# Patient Record
Sex: Male | Born: 1995 | Race: Black or African American | Hispanic: No | Marital: Single | Smoking: Never smoker
Health system: Southern US, Community
[De-identification: ages and names within clinical notes are randomized; demographics above are authoritative.]

---
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
## 2016-01-24 ENCOUNTER — Emergency Department (HOSPITAL_COMMUNITY)
Admission: EM | Admit: 2016-01-24 | Discharge: 2016-01-24 | Disposition: A | Discharge status: Home / Self Care | Payer: Self-pay | Attending: Emergency Medicine | Admitting: Emergency Medicine

## 2016-01-24 ENCOUNTER — Encounter (HOSPITAL_COMMUNITY): Payer: Self-pay | Admitting: *Deleted

## 2016-01-24 ENCOUNTER — Emergency Department (HOSPITAL_COMMUNITY): Payer: Self-pay

## 2016-01-24 DIAGNOSIS — F10120 Alcohol abuse with intoxication, uncomplicated: Secondary | ICD-10-CM | POA: Insufficient documentation

## 2016-01-24 DIAGNOSIS — F10920 Alcohol use, unspecified with intoxication, uncomplicated: Secondary | ICD-10-CM

## 2016-01-24 LAB — I-STAT TROPONIN, ED: Troponin i, poc: 0 ng/mL (ref 0.00–0.08)

## 2016-01-24 LAB — COMPREHENSIVE METABOLIC PANEL
ALT: 35 U/L (ref 17–63)
ANION GAP: 10 (ref 5–15)
AST: 29 U/L (ref 15–41)
Albumin: 4 g/dL (ref 3.5–5.0)
Alkaline Phosphatase: 52 U/L (ref 38–126)
BILIRUBIN TOTAL: 0.3 mg/dL (ref 0.3–1.2)
BUN: 10 mg/dL (ref 6–20)
CALCIUM: 8.2 mg/dL — AB (ref 8.9–10.3)
CO2: 21 mmol/L — ABNORMAL LOW (ref 22–32)
Chloride: 106 mmol/L (ref 101–111)
Creatinine, Ser: 1.08 mg/dL (ref 0.61–1.24)
Glucose, Bld: 105 mg/dL — ABNORMAL HIGH (ref 65–99)
POTASSIUM: 3.3 mmol/L — AB (ref 3.5–5.1)
Sodium: 137 mmol/L (ref 135–145)
TOTAL PROTEIN: 6.6 g/dL (ref 6.5–8.1)

## 2016-01-24 LAB — CBC
HEMATOCRIT: 40.5 % (ref 39.0–52.0)
HEMOGLOBIN: 13.6 g/dL (ref 13.0–17.0)
MCH: 27.9 pg (ref 26.0–34.0)
MCHC: 33.6 g/dL (ref 30.0–36.0)
MCV: 83.2 fL (ref 78.0–100.0)
Platelets: 242 10*3/uL (ref 150–400)
RBC: 4.87 MIL/uL (ref 4.22–5.81)
RDW: 13.3 % (ref 11.5–15.5)
WBC: 7.5 10*3/uL (ref 4.0–10.5)

## 2016-01-24 LAB — RAPID URINE DRUG SCREEN, HOSP PERFORMED
Amphetamines: NOT DETECTED
BARBITURATES: NOT DETECTED
BENZODIAZEPINES: NOT DETECTED
COCAINE: NOT DETECTED
Opiates: NOT DETECTED
TETRAHYDROCANNABINOL: NOT DETECTED

## 2016-01-24 LAB — ETHANOL: Alcohol, Ethyl (B): 248 mg/dL — ABNORMAL HIGH (ref <5)

## 2016-01-24 LAB — ACETAMINOPHEN LEVEL

## 2016-01-24 LAB — SALICYLATE LEVEL: Salicylate Lvl: <4 mg/dL (ref 2.8–30.0)

## 2016-01-24 LAB — CBG MONITORING, ED: GLUCOSE-CAPILLARY: 104 mg/dL — AB (ref 65–99)

## 2016-01-24 MED ORDER — SODIUM CHLORIDE 0.9 % IV BOLUS (SEPSIS)
1000.0000 mL | Freq: Once | INTRAVENOUS | Status: AC
  Administered 2016-01-24: 1000 mL via INTRAVENOUS

## 2016-01-24 MED ORDER — NALOXONE HCL 0.4 MG/ML IJ SOLN
0.4000 mg | Freq: Once | INTRAMUSCULAR | Status: AC
  Administered 2016-01-24: 0.4 mg via INTRAVENOUS

## 2016-01-24 MED ORDER — NALOXONE HCL 0.4 MG/ML IJ SOLN
INTRAMUSCULAR | Status: AC
  Filled 2016-01-24: qty 1

## 2016-01-24 NOTE — Discharge Instructions (Signed)
Avoid drinking too much alcohol.   Stay hydrated.   See your doctor.   Return to ER if you have vomiting, lethargy, passing out.

## 2016-01-24 NOTE — ED Notes (Addendum)
[facility] called to home for unresponsive 20 yo.  When EMS arrived pt was responsive to pain and was vomiting (pt held up in chair by fam).  CBG 148.  Initial O2 sats low 90's, given 500 ns en-route. ST elevation in V2 and V3. Dad states pt has been depressed for last few weeks and has been fighting with girlfriend.  Drank approx 1.5 pints vodka.  No drugs noted on scene.  LSN 1530.

## 2016-01-24 NOTE — ED Provider Notes (Signed)
CSN: [id number]     Arrival date & time 01/24/16  1757 History   First MD Initiated Contact with Patient 01/24/16 1800     Chief Complaint  Patient presents with  . Altered Mental Status   HPI  [facility] called to home for unresponsive 20 yo. When EMS arrived pt was responsive to pain and was vomiting (pt held up in chair by fam). CBG 148. Initial O2 sats low 90's, given 500 ns en-route. ST elevation in V2 and V3. Dad states pt has been depressed for last few weeks and has been fighting with girlfriend. Drank approx 1.5 pints vodka. No drugs noted on scene. LSN 1530. Per family, no known substance abuse issues. Not known to be suicidal. History limited due to patient's mental condition.  History reviewed. No pertinent past medical history. History reviewed. No pertinent past surgical history. No family history on file. Social History  Substance Use Topics  . Smoking status: Never Smoker   . Smokeless tobacco: None  . Alcohol Use: Yes     Comment: occ    Review of Systems  Unable to perform ROS: Mental status change   Allergies  Review of patient's allergies indicates no known allergies.  Home Medications   Prior to Admission medications   Not on File   BP 112/75 mmHg  Pulse 82  Resp 26  SpO2 100% Physical Exam General: well nourished, well hydrated, no acute distress Eyes: conjunctivae and lids normal; PERRLA 3mm bilateral, atraumatic, pt actively closing eyes Neck: no masses/bruising, trachea midline Nose: no nasal septal hematoma Ears: atraumatic,  Respiratory: no intercostal retractions or use of accessory muscles, decreased breath sounds bilaterally Cardiovascular: Nml S1, S2; intact/symmetric distal pulses Gastrointestinal: Abdomen soft, non-distended, no masses, bowel sounds normal  Extremities: MAE, FROM, no cyanosis or edema MSK: no crepitus along face, scalp, clavicles, chest anteriorly or posteriorly, pelvis, right upper extremity, left upper extremity, right  lower extremity, left lower extremity.  Neuro: Limited secondary to lethargy. Patient minimally responsive to pain  ED Course  Procedures (including critical care time) Labs Review Labs Reviewed  COMPREHENSIVE METABOLIC PANEL - Abnormal; Notable for the following:    Potassium 3.3 (*)    CO2 21 (*)    Glucose, Bld 105 (*)    Calcium 8.2 (*)    All other components within normal limits  ETHANOL - Abnormal; Notable for the following:    Alcohol, Ethyl (B) 248 (*)    All other components within normal limits  ACETAMINOPHEN LEVEL - Abnormal; Notable for the following:    Acetaminophen (Tylenol), Serum <10 (*)    All other components within normal limits  CBG MONITORING, ED - Abnormal; Notable for the following:    Glucose-Capillary 104 (*)    All other components within normal limits  SALICYLATE LEVEL  CBC  URINE RAPID DRUG SCREEN, HOSP PERFORMED  I-STAT TROPOININ, ED    Imaging Review Dg Chest Portable 1 View  01/24/2016  CLINICAL DATA:  Altered mental status.  Aspiration. EXAM: PORTABLE CHEST 1 VIEW COMPARISON:  None FINDINGS: Portable supine views of the chest were obtained. Lungs are clear without airspace disease or pulmonary edema. Patient is mildly rotated towards the left. Heart size is within normal limits. Negative for a pneumothorax. No acute bone abnormality. IMPRESSION: No acute findings. Electronically Signed   By: Richarda Overlie M.D.   On: 01/24/2016 18:27   I have personally reviewed and evaluated these images and lab results as part of my medical decision-making.  EKG Interpretation   Date/Time:  Friday January 24 2016 18:02:39 EDT Ventricular Rate:  76 PR Interval:  144 QRS Duration: 104 QT Interval:  391 QTC Calculation: 440 R Axis:   85 Text Interpretation:  Sinus rhythm Consider right atrial enlargement  Probable left ventricular hypertrophy Probable inferior infarct, old  Lateral infarct, recent No significant change since last tracing earlier  in the day   Confirmed by YAO  MD, DAVID ([id number]) on 01/24/2016 6:11:41 PM      MDM   Final diagnoses:  Alcohol intoxication, uncomplicated (HCC)   Patient presents after drinking heavy alcohol, positive EtOH here. Initially very obtunded but responding to pain. Given fluids and monitored. Oxygen requirement here up did have decrease breath sounds bilaterally initially. Chest x-ray unremarkable 7:20p - pt reassessed, continues minimally responsive. Will ctm. Gf updated.  7:30p - she has waking up. Hugging everyone. 8pm - patient is conversive, no SI, labs are reassuring. Has family to accompany him. Patient will be discharged to continue to metabolize at home with strict return precautions.    Sidney AceAlison Charruf Melayah Skorupski, MD 01/24/16 2059  Richardean Canalavid H Yao, MD 01/25/16 [phone]

## 2018-01-27 IMAGING — CR DG CHEST 1V PORT
2 series · 2 of 2 positions shown · non-contrast
Comparison: None

CLINICAL DATA: Altered mental status.  Aspiration.

EXAM:
PORTABLE CHEST 1 VIEW

[AP (1 of 2)]
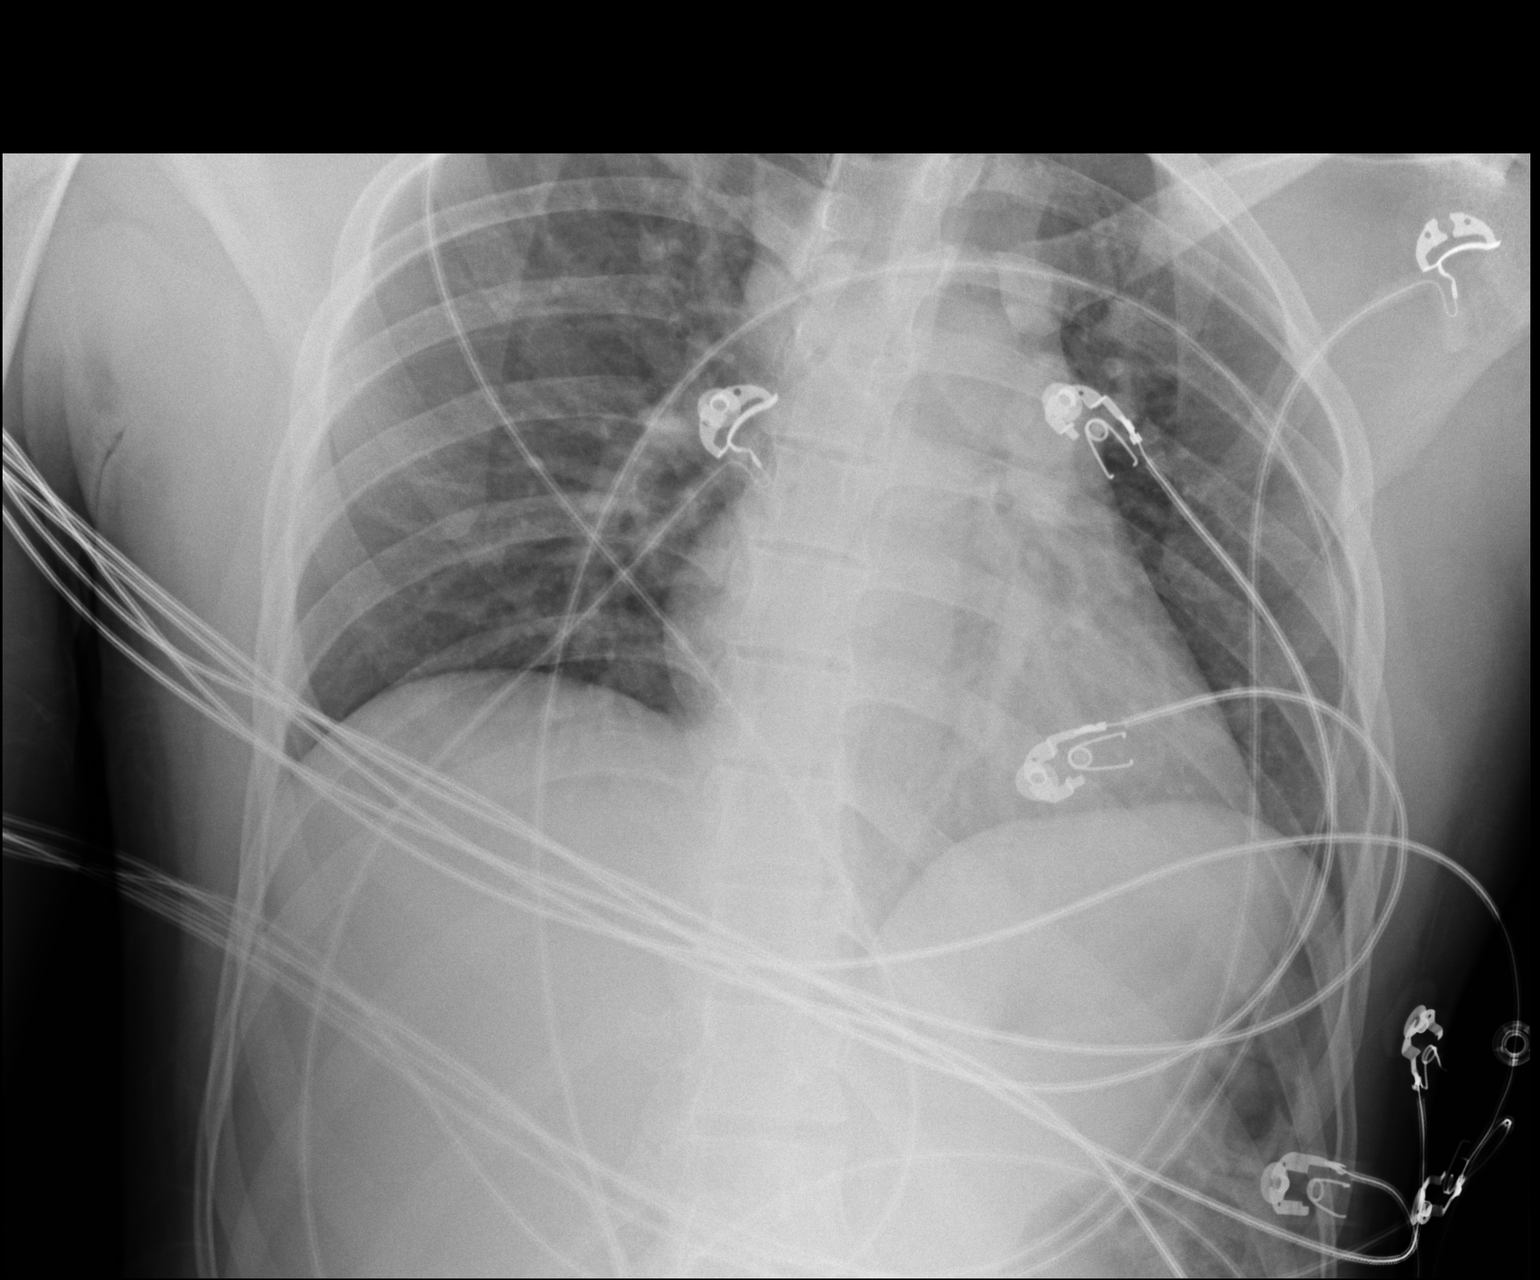

[AP (2 of 2)]
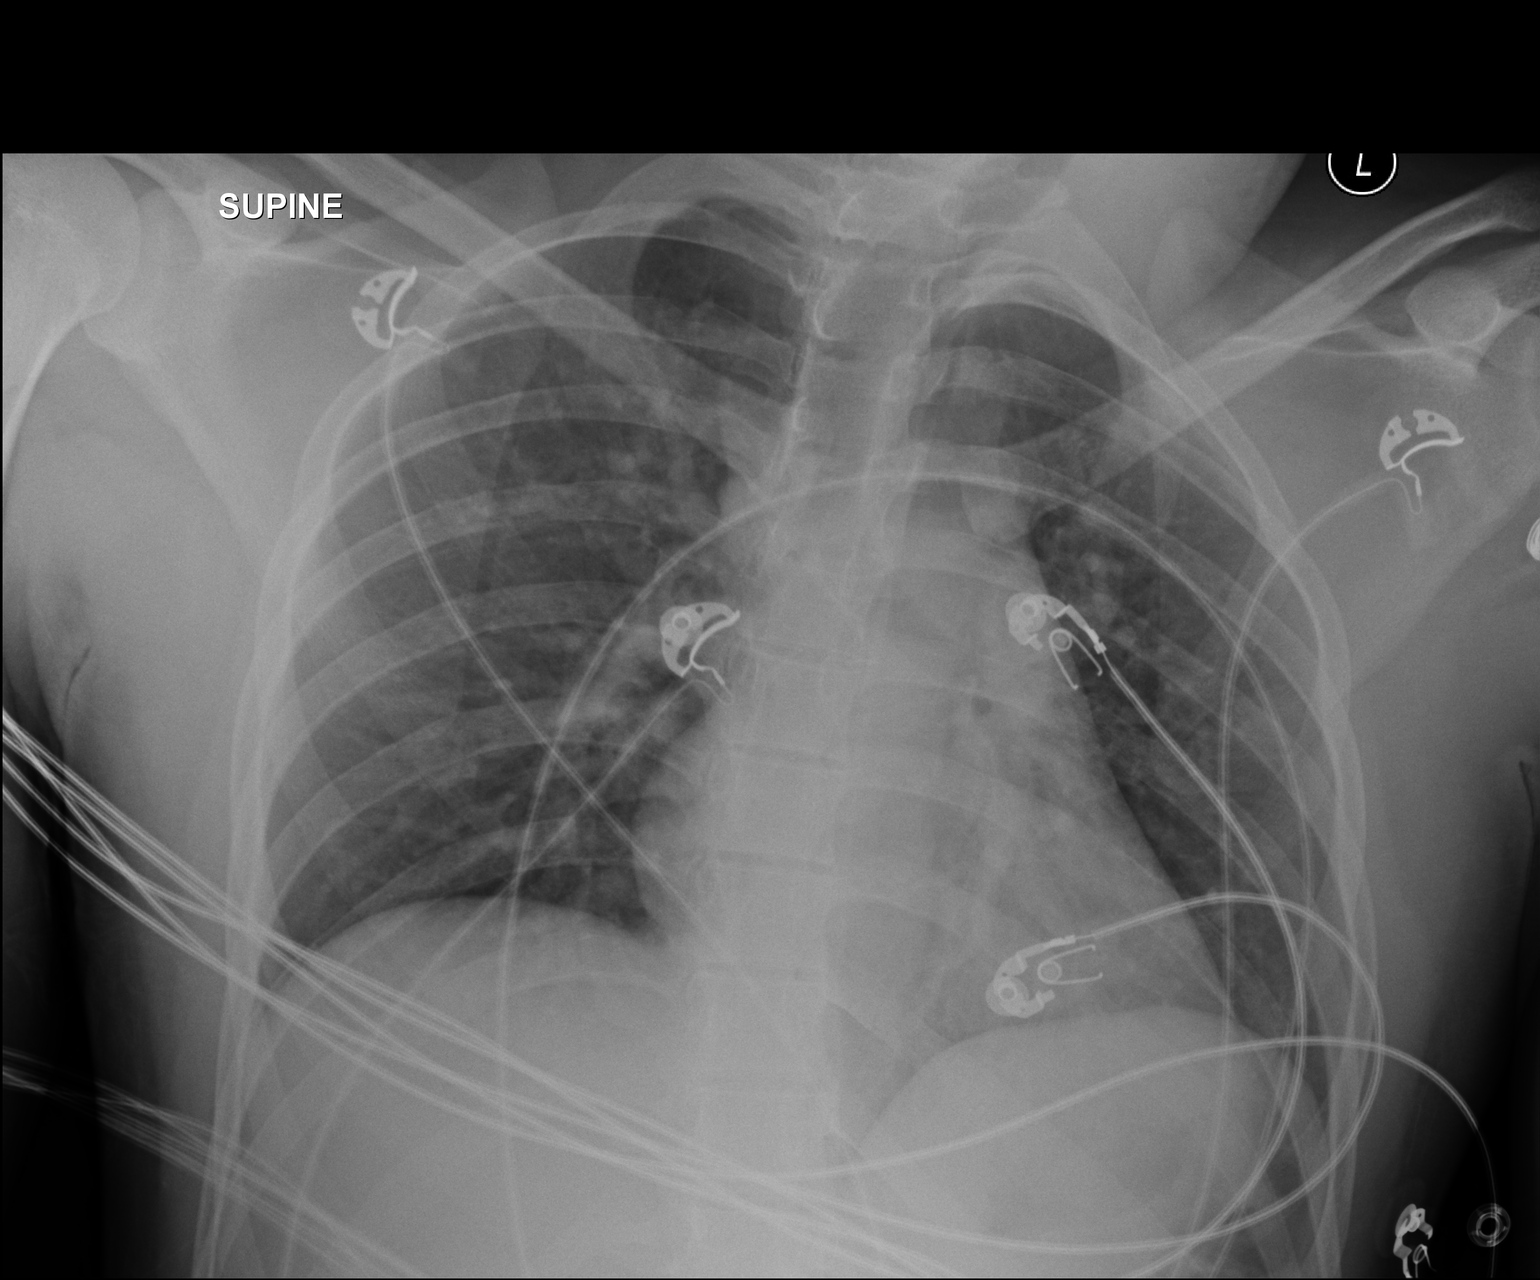

[2 of 2 positions shown; findings below may reference images not displayed]

FINDINGS: Portable supine views of the chest were obtained. Lungs are clear
without airspace disease or pulmonary edema. Patient is mildly
rotated towards the left. Heart size is within normal limits.
Negative for a pneumothorax. No acute bone abnormality.
IMPRESSION: No acute findings.

## 2023-02-10 ENCOUNTER — Ambulatory Visit
Admission: EM | Admit: 2023-02-10 | Discharge: 2023-02-10 | Disposition: A | Discharge status: Home / Self Care | Payer: 59 | Attending: Internal Medicine | Admitting: Internal Medicine

## 2023-02-10 ENCOUNTER — Ambulatory Visit: Admission: EM | Admit: 2023-02-10 | Payer: Self-pay

## 2023-02-10 DIAGNOSIS — S61011A Laceration without foreign body of right thumb without damage to nail, initial encounter: Secondary | ICD-10-CM

## 2023-02-10 DIAGNOSIS — Z23 Encounter for immunization: Secondary | ICD-10-CM

## 2023-02-10 MED ORDER — TETANUS-DIPHTH-ACELL PERTUSSIS 5-2.5-18.5 LF-MCG/0.5 IM SUSY
0.5000 mL | PREFILLED_SYRINGE | Freq: Once | INTRAMUSCULAR | Status: AC
  Administered 2023-02-10: 0.5 mL via INTRAMUSCULAR

## 2023-02-10 NOTE — Discharge Instructions (Signed)
Keep covered until healed over. Change dressing daily. Monitor for signs of infection that include increased redness, swelling, pus and follow-up sooner if this occurs.

## 2023-02-10 NOTE — ED Provider Notes (Signed)
[facility]    CSN: [id number] Arrival date & time: 02/10/23  1738      History   Chief Complaint Chief Complaint  Patient presents with   Laceration    HPI Connor Cowan is a 27 y.o. male.   Patient presents with laceration to right thumb that occurred directly prior to arrival to urgent care.  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  Denies numbness or tingling.  Has full range of motion of thumb.  Patient is not sure of last tetanus vaccine.   Laceration   History reviewed. No pertinent past medical history.  There are no problems to display for this patient.   History reviewed. No pertinent surgical history.     Home Medications    Prior to Admission medications   Not on File    Family History History reviewed. No pertinent family history.  Social History Social History   Tobacco Use   Smoking status: Never    Passive exposure: Never   Smokeless tobacco: Never  Vaping Use   Vaping Use: Never used  Substance Use Topics   Alcohol use: Yes    Comment: occ   Drug use: No     Allergies   Patient has no known allergies.   Review of Systems Review of Systems Per HPI  Physical Exam Triage Vital Signs ED Triage Vitals  Enc Vitals Group     BP 02/10/23 1847 (!) 145/83     Pulse Rate 02/10/23 1847 90     Resp 02/10/23 1847 18     Temp 02/10/23 1847 98.3 F (36.8 C)     Temp Source 02/10/23 1847 Oral     SpO2 02/10/23 1847 97 %     Weight 02/10/23 1846 190 lb (86.2 kg)     Height 02/10/23 1846 6\' 1"  (1.854 m)     Head Circumference --      Peak Flow --      Pain Score 02/10/23 1845 6     Pain Loc --      Pain Edu? --      Excl. in GC? --    No data found.  Updated Vital Signs BP (!) 145/99 (BP Location: Left Arm)   Pulse 90   Temp 98.3 F (36.8 C) (Oral)   Resp 18   Ht 6\' 1"  (1.854 m)   Wt 190 lb (86.2 kg)   SpO2 97%   BMI 25.07 kg/m    Visual Acuity Right Eye Distance:   Left Eye Distance:   Bilateral Distance:    Right Eye Near:   Left Eye Near:    Bilateral Near:     Physical Exam Constitutional:      General: He is not in acute distress.    Appearance: Normal appearance. He is not toxic-appearing or diaphoretic.  HENT:     Head: Normocephalic and atraumatic.  Eyes:     Extraocular Movements: Extraocular movements intact.     Conjunctiva/sclera: Conjunctivae normal.  Pulmonary:     Effort: Pulmonary effort is normal.  Skin:    Comments: Patient has a curvilinear very superficial avulsion laceration present to the inner portion of the right thumb at the center.  Full range of motion present.  Bleeding controlled.  Capillary refill and pulses intact.  Neurological:     General: No focal deficit present.     Mental  Status: He is alert and oriented to person, place, and time. Mental status is at baseline.  Psychiatric:        Mood and Affect: Mood normal.        Behavior: Behavior normal.        Thought Content: Thought content normal.        Judgment: Judgment normal.      UC Treatments / Results  Labs (all labs ordered are listed, but only abnormal results are displayed) Labs Reviewed - No data to display  EKG   Radiology No results found.  Procedures Procedures (including critical care time)  Medications Ordered in UC Medications  Tdap (BOOSTRIX) injection 0.5 mL (0.5 mLs Intramuscular Given 02/10/23 1917)    Initial Impression / Assessment and Plan / UC Course  I have reviewed the triage vital signs and the nursing notes.  Pertinent labs & imaging results that were available during my care of the patient were reviewed by me and considered in my medical decision making (see chart for details).     Patient has an avulsion laceration that is very superficial present to the right thumb.  No sutures or closure needed.  Wound was cleaned by clinical staff and nonadherent dressing applied.   Advised patient of dressing changes.  Advised patient to monitor for signs of infection as well.  Tetanus vaccine updated today.  Advised strict return precautions.  Patient verbalized understanding and was agreeable with plan. Final Clinical Impressions(s) / UC Diagnoses   Final diagnoses:  Laceration of right thumb without foreign body without damage to nail, initial encounter     Discharge Instructions      Keep covered until healed over. Change dressing daily. Monitor for signs of infection that include increased redness, swelling, pus and follow-up sooner if this occurs.    ED Prescriptions   None    PDMP not reviewed this encounter.   Gustavus Bryant, [place] 02/11/23 [phone]

## 2023-02-10 NOTE — ED Triage Notes (Signed)
Laceration to Right Thumb. "Just happened, It was a pizza cutter and while cleaning dishes reached into water and cut Thumb". Time: 1715. Location: "@ work, [employer]" (Will report it). 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
